# Patient Record
Sex: Male | Born: 1980 | Race: White | Hispanic: No | Smoking: Never smoker
Health system: Southern US, Community
[De-identification: ages and names within clinical notes are randomized; demographics above are authoritative.]

## PROBLEM LIST (undated history)

## (undated) HISTORY — PX: HAND SURGERY: SHX662

## (undated) HISTORY — PX: HIP SURGERY: SHX245

---

## 2019-10-23 ENCOUNTER — Other Ambulatory Visit: Payer: Self-pay

## 2019-10-23 ENCOUNTER — Ambulatory Visit
Admission: RE | Admit: 2019-10-23 | Discharge: 2019-10-23 | Disposition: A | Discharge status: Home / Self Care | Payer: Self-pay | Source: Ambulatory Visit | Attending: Emergency Medicine | Admitting: Emergency Medicine

## 2019-10-23 VITALS — BP 133/98 | HR 72 | Temp 98.8°F | Resp 17 | Ht 72.0 in | Wt 235.0 lb

## 2019-10-23 DIAGNOSIS — K047 Periapical abscess without sinus: Secondary | ICD-10-CM

## 2019-10-23 MED ORDER — IBUPROFEN 800 MG PO TABS: 800.0000 mg | ORAL_TABLET | Freq: Three times a day (TID) | ORAL | 0 refills | Status: AC

## 2019-10-23 MED ORDER — AMOXICILLIN-POT CLAVULANATE 875-125 MG PO TABS: 1.0000 | ORAL_TABLET | Freq: Two times a day (BID) | ORAL | 0 refills | Status: AC

## 2019-10-23 NOTE — ED Triage Notes (Signed)
Lower LT side dental swelling that started this morning

## 2019-10-23 NOTE — Discharge Instructions (Signed)
Augmentin- twice daily for 1 week  For pain please take 600mg -800mg  of Ibuprofen every 8 hours, take with 1000 mg of Tylenol Extra strength every 8 hours. These are safe to take together. Please take with food.   Please return if you start to experience significant swelling of your face, experiencing fever.

## 2019-10-23 NOTE — ED Provider Notes (Signed)
[facility]    CSN: [id number] Arrival date & time: 10/23/19  1136      History   Chief Complaint Chief Complaint  Patient presents with  . Oral Swelling    HPI Stephen Weiss is a 39 y.o. male for evaluation of dental pain and swelling. Patient reports he has had problems to his right lower posterior molar for about a month. Over the past 1 to 2 days he has developed increased pain and swelling in this area and is concerned he has developed a dental abscess. 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 He denies any fevers. Denies neck stiffness. Denies difficulty swallowing.  HPI  History reviewed. No pertinent past medical history.  There are no problems to display for this patient.   Past Surgical History:  Procedure Laterality Date  . HAND SURGERY    . HIP SURGERY Left        Home Medications    Prior to Admission medications   Medication Sig Start Date End Date Taking? Authorizing Provider  amoxicillin-clavulanate (AUGMENTIN) 875-125 MG tablet Take 1 tablet by mouth every 12 (twelve) hours for 7 days. 10/23/19 10/30/19  Elsie Baynes C, PA-C  ibuprofen (ADVIL) 800 MG tablet Take 1 tablet (800 mg total) by mouth 3 (three) times daily. 10/23/19   Treyana Sturgell, Junius Creamer, PA-C    Family History No family history on file.  Social History Social History   Tobacco Use  . Smoking status: Not on file  Substance Use Topics  . Alcohol use: Not on file  . Drug use: Not on file     Allergies   Patient has no known allergies.   Review of Systems Review of Systems  Constitutional: Negative for activity change, appetite change, chills, fatigue and fever.  HENT: Positive for dental problem and facial swelling. Negative for congestion, ear pain, rhinorrhea, sinus pressure, sore throat and trouble swallowing.   Eyes: Negative for discharge and redness.  Respiratory: Negative for cough, chest tightness and shortness of breath.     Cardiovascular: Negative for chest pain.  Gastrointestinal: Negative for abdominal pain, diarrhea, nausea and vomiting.  Musculoskeletal: Negative for myalgias.  Skin: Negative for rash.  Neurological: Negative for dizziness, light-headedness and headaches.     Physical Exam Triage Vital Signs ED Triage Vitals  Enc Vitals Group     BP 10/23/19 1227 (!) 133/98     Pulse Rate 10/23/19 1227 72     Resp 10/23/19 1227 17     Temp 10/23/19 1227 98.8 F (37.1 C)     Temp Source 10/23/19 1227 Tympanic     SpO2 10/23/19 1227 97 %     Weight 10/23/19 1243 235 lb (106.6 kg)     Height 10/23/19 1243 6' (1.829 m)     Head Circumference --      Peak Flow --      Pain Score 10/23/19 1243 7     Pain Loc --      Pain Edu? --      Excl. in GC? --    No data found.  Updated Vital Signs BP (!) 133/98 (BP Location: Right Arm)   Pulse 72   Temp 98.8 F (37.1 C) (Tympanic)   Resp 17   Ht 6' (1.829 m)   Wt 235 lb (106.6 kg)   SpO2 97%   BMI 31.87 kg/m   Visual Acuity Right Eye Distance:   Left Eye Distance:   Bilateral Distance:  Right Eye Near:   Left Eye Near:    Bilateral Near:     Physical Exam Vitals and nursing note reviewed.  Constitutional:      Appearance: He is well-developed.     Comments: No acute distress  HENT:     Head: Normocephalic and atraumatic.     Comments: Facial swelling noted to left lower jaw, tender to palpation, no significant overlying erythema    Ears:     Comments: Bilateral ears without tenderness to palpation of external auricle, tragus and mastoid, EAC's without erythema or swelling, TM's with good bony landmarks and cone of light. Non erythematous.     Nose: Nose normal.     Mouth/Throat:     Comments: Posterior molar on left lower jaw broken, surrounding gingival swelling erythema and tenderness, posterior pharynx patent, no soft palate swelling Eyes:     Conjunctiva/sclera: Conjunctivae normal.  Neck:     Comments: Full active range  of motion of neck, no overlying neck swelling or erythema Cardiovascular:     Rate and Rhythm: Normal rate.  Pulmonary:     Effort: Pulmonary effort is normal. No respiratory distress.  Abdominal:     General: There is no distension.  Musculoskeletal:        General: Normal range of motion.     Cervical back: Neck supple.  Skin:    General: Skin is warm and dry.  Neurological:     Mental Status: He is alert and oriented to person, place, and time.      UC Treatments / Results  Labs (all labs ordered are listed, but only abnormal results are displayed) Labs Reviewed - No data to display  EKG   Radiology No results found.  Procedures Procedures (including critical care time)  Medications Ordered in UC Medications - No data to display  Initial Impression / Assessment and Plan / UC Course  I have reviewed the triage vital signs and the nursing notes.  Pertinent labs & imaging results that were available during my care of the patient were reviewed by me and considered in my medical decision making (see chart for details).     Treating for dental abscess with Augmentin twice daily x1 week. Tylenol and ibuprofen for pain, warm compresses. No sign of deep space infection at this time or Ludwig's angina. Continue to monitor,Discussed strict return precautions. Patient verbalized understanding and is agreeable with plan.  Final Clinical Impressions(s) / UC Diagnoses   Final diagnoses:  Dental abscess     Discharge Instructions     Augmentin- twice daily for 1 week  For pain please take 600mg -800mg  of Ibuprofen every 8 hours, take with 1000 mg of Tylenol Extra strength every 8 hours. These are safe to take together. Please take with food.   Please return if you start to experience significant swelling of your face, experiencing fever.    ED Prescriptions    Medication Sig Dispense Auth. Provider   ibuprofen (ADVIL) 800 MG tablet Take 1 tablet (800 mg total) by  mouth 3 (three) times daily. 21 tablet Bridgit Eynon C, PA-C   amoxicillin-clavulanate (AUGMENTIN) 875-125 MG tablet Take 1 tablet by mouth every 12 (twelve) hours for 7 days. 14 tablet Elaf Clauson, [place] C, PA-C     PDMP not reviewed this encounter.   , PA-C 10/23/19 1342

## 2020-04-16 ENCOUNTER — Other Ambulatory Visit: Payer: Self-pay

## 2020-04-16 ENCOUNTER — Ambulatory Visit (INDEPENDENT_AMBULATORY_CARE_PROVIDER_SITE_OTHER): Payer: BC Managed Care – PPO

## 2020-04-16 ENCOUNTER — Ambulatory Visit (HOSPITAL_COMMUNITY)
Admission: EM | Admit: 2020-04-16 | Discharge: 2020-04-16 | Disposition: A | Discharge status: Home / Self Care | Payer: BC Managed Care – PPO | Attending: Student | Admitting: Student

## 2020-04-16 ENCOUNTER — Encounter (HOSPITAL_COMMUNITY): Payer: Self-pay | Admitting: Emergency Medicine

## 2020-04-16 DIAGNOSIS — M79672 Pain in left foot: Secondary | ICD-10-CM | POA: Diagnosis not present

## 2020-04-16 DIAGNOSIS — S62512A Displaced fracture of proximal phalanx of left thumb, initial encounter for closed fracture: Secondary | ICD-10-CM | POA: Diagnosis not present

## 2020-04-16 DIAGNOSIS — M79645 Pain in left finger(s): Secondary | ICD-10-CM

## 2020-04-16 DIAGNOSIS — S0101XA Laceration without foreign body of scalp, initial encounter: Secondary | ICD-10-CM

## 2020-04-16 DIAGNOSIS — S60519A Abrasion of unspecified hand, initial encounter: Secondary | ICD-10-CM | POA: Diagnosis not present

## 2020-04-16 DIAGNOSIS — W19XXXA Unspecified fall, initial encounter: Secondary | ICD-10-CM | POA: Diagnosis not present
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
## 2020-04-16 NOTE — Discharge Instructions (Addendum)
-  keep your hand splint on as much as possible until you follow-up with orthopedist.  -Call [facility] tomorrow morning to schedule follow-up appointment with them. They will probably see you tomorrow, but if they decide it can wait until next week, that's okay too.  -Use ice, tylenol/ibuprofen for pain -Try to keep your scalp laceration dry for the next 5 days. This will allow the dermabond to stick.  -Gently wash your other abrasions with soap and water 1-2x daily. You can reapply bandaid after.  -If you develop new symptoms like loss of consciousness, confusion, memory loss, etc; or changes in sensation to your left hand/thumb; head to the ED immediately.

## 2020-04-17 ENCOUNTER — Telehealth (HOSPITAL_COMMUNITY): Payer: Self-pay | Admitting: Internal Medicine

## 2020-04-17 MED ORDER — TRAMADOL HCL 50 MG PO TABS: 50.0000 mg | ORAL_TABLET | Freq: Two times a day (BID) | ORAL | 0 refills | Status: DC | PRN

## 2020-04-17 NOTE — Telephone Encounter (Signed)
Pain medications sent to pharmacy on record

## 2021-07-23 ENCOUNTER — Encounter (HOSPITAL_COMMUNITY): Payer: Self-pay

## 2021-07-23 ENCOUNTER — Ambulatory Visit (INDEPENDENT_AMBULATORY_CARE_PROVIDER_SITE_OTHER): Payer: BC Managed Care – PPO

## 2021-07-23 ENCOUNTER — Ambulatory Visit (HOSPITAL_COMMUNITY)
Admission: EM | Admit: 2021-07-23 | Discharge: 2021-07-23 | Disposition: A | Discharge status: Home / Self Care | Payer: BC Managed Care – PPO | Attending: Internal Medicine | Admitting: Internal Medicine

## 2021-07-23 DIAGNOSIS — M79641 Pain in right hand: Secondary | ICD-10-CM | POA: Diagnosis not present

## 2021-07-23 DIAGNOSIS — S0083XA Contusion of other part of head, initial encounter: Secondary | ICD-10-CM | POA: Diagnosis not present

## 2021-07-23 DIAGNOSIS — S0081XA Abrasion of other part of head, initial encounter: Secondary | ICD-10-CM

## 2021-07-23 DIAGNOSIS — S6000XA Contusion of unspecified finger without damage to nail, initial encounter: Secondary | ICD-10-CM | POA: Diagnosis not present

## 2021-07-23 NOTE — Discharge Instructions (Addendum)
Advised to use ice, 10 minutes on 20 minutes off, 3-4 times a day to decrease the swelling and the pain in the right hand. Advised to use ibuprofen 600 to 800 mg every 8 hours to decrease the pain of the hand. Advised to follow-up with PCP or return to urgent care if symptoms fail to improve.
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
## 2021-10-25 ENCOUNTER — Ambulatory Visit (HOSPITAL_COMMUNITY)
Admission: EM | Admit: 2021-10-25 | Discharge: 2021-10-25 | Disposition: A | Discharge status: Home / Self Care | Payer: BC Managed Care – PPO | Attending: Internal Medicine | Admitting: Internal Medicine

## 2021-10-25 ENCOUNTER — Encounter (HOSPITAL_COMMUNITY): Payer: Self-pay | Admitting: Emergency Medicine

## 2021-10-25 DIAGNOSIS — H00011 Hordeolum externum right upper eyelid: Secondary | ICD-10-CM

## 2021-10-25 MED ORDER — ERYTHROMYCIN 5 MG/GM OP OINT: TOPICAL_OINTMENT | OPHTHALMIC | 0 refills | Status: AC

## 2021-10-25 NOTE — Discharge Instructions (Signed)
Please continue using the warm wet rag 2-3 times a day Apply eye ointment twice a day If you have worsening pain, swelling, discharge please return to urgent care to be reevaluated.

## 2021-10-25 NOTE — ED Triage Notes (Signed)
Pt reports swelling to right eye for a couple weeks. Thought was stye and tried OTC stye medications and warm rag but not helping.

## 2021-10-26 NOTE — ED Provider Notes (Signed)
[facility]    CSN: [id number] Arrival date & time: 10/25/21  0820      History   Chief Complaint Chief Complaint  Patient presents with   Facial Swelling    HPI Stephen Weiss is a 41 y.o. male comes to the urgent care with a 2-week history of right upper eyelid swelling.  Symptoms started insidiously and has progressed.  Patient has tried over-the-counter medications with no improvement in symptoms.  The swelling is worsening and the upper eyelid is painful.  No redness over the upper eyelid.  The left upper eyelid is also developing swelling.  No itchy eyes or redness of the eye.  HPI  History reviewed. No pertinent past medical history.  There are no problems to display for this patient.   Past Surgical History:  Procedure Laterality Date   HAND SURGERY     HIP SURGERY Left    HIP SURGERY         Home Medications    Prior to Admission medications   Medication Sig Start Date End Date Taking? Authorizing Provider  erythromycin ophthalmic ointment Place a 1/2 inch ribbon of ointment into the lower eyelid. 10/25/21  Yes Yakir Wenke, Britta Mccreedy, MD  ibuprofen (ADVIL) 800 MG tablet Take 1 tablet (800 mg total) by mouth 3 (three) times daily. 10/23/19   Wieters, Junius Creamer, PA-C    Family History No family history on file.  Social History Social History   Tobacco Use   Smoking status: Never   Smokeless tobacco: Never  Substance Use Topics   Alcohol use: Yes   Drug use: Never     Allergies   Patient has no known allergies.   Review of Systems Review of Systems  HENT: Negative.    Eyes:  Positive for pain. Negative for photophobia, discharge, redness, itching and visual disturbance.  Respiratory: Negative.    Cardiovascular: Negative.   Neurological: Negative.      Physical Exam Triage Vital Signs ED Triage Vitals  Enc Vitals Group     BP 10/25/21 0919 (!) 138/91     Pulse Rate 10/25/21 0919 75     Resp 10/25/21 0919 17     Temp 10/25/21 0919  98 F (36.7 C)     Temp Source 10/25/21 0919 Oral     SpO2 10/25/21 0919 97 %     Weight --      Height --      Head Circumference --      Peak Flow --      Pain Score 10/25/21 0918 0     Pain Loc --      Pain Edu? --      Excl. in GC? --    No data found.  Updated Vital Signs BP (!) 138/91 (BP Location: Left Arm)   Pulse 75   Temp 98 F (36.7 C) (Oral)   Resp 17   SpO2 97%   Visual Acuity Right Eye Distance:   Left Eye Distance:   Bilateral Distance:    Right Eye Near:   Left Eye Near:    Bilateral Near:     Physical Exam Vitals and nursing note reviewed.  Constitutional:      Appearance: Normal appearance.  HENT:     Right Ear: Tympanic membrane normal.     Left Ear: Tympanic membrane normal.  Eyes:     Extraocular Movements: Extraocular movements intact.     Conjunctiva/sclera: Conjunctivae normal.     Pupils: Pupils are  equal, round, and reactive to light.     Comments: Tender firm localized swelling in both upper eyelids.  Measures about 5 mm in diameter.  Neurological:     Mental Status: He is alert.      UC Treatments / Results  Labs (all labs ordered are listed, but only abnormal results are displayed) Labs Reviewed - No data to display  EKG   Radiology No results found.  Procedures Procedures (including critical care time)  Medications Ordered in UC Medications - No data to display  Initial Impression / Assessment and Plan / UC Course  I have reviewed the triage vital signs and the nursing notes.  Pertinent labs & imaging results that were available during my care of the patient were reviewed by me and considered in my medical decision making (see chart for details).     1.External hordeolum bilaterally: Failed over-the-counter therapy Erythromycin eye ointment twice daily for 5 days Continue warm compresses Tylenol as needed for pain and/or fever Return precautions given  Final Clinical Impressions(s) / UC Diagnoses   Final  diagnoses:  Hordeolum externum of right upper eyelid     Discharge Instructions      Please continue using the warm wet rag 2-3 times a day Apply eye ointment twice a day If you have worsening pain, swelling, discharge please return to urgent care to be reevaluated.   ED Prescriptions     Medication Sig Dispense Auth. Provider   erythromycin ophthalmic ointment Place a 1/2 inch ribbon of ointment into the lower eyelid. 3.5 g Erielle Gawronski, Britta Mccreedy, MD      PDMP not reviewed this encounter.   Merrilee Jansky, MD 10/26/21 [id number]
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
